# Patient Record
Sex: Male | Born: 1985 | Race: White | Hispanic: No | Marital: Single | Smoking: Current every day smoker
Health system: Southern US, Community
[De-identification: ages and names within clinical notes are randomized; demographics above are authoritative.]

---

## 2004-04-28 ENCOUNTER — Emergency Department: Payer: Self-pay | Admitting: Emergency Medicine

## 2004-04-29 ENCOUNTER — Ambulatory Visit: Payer: Self-pay | Admitting: Emergency Medicine

## 2005-06-25 ENCOUNTER — Emergency Department: Payer: Self-pay | Admitting: Internal Medicine

## 2005-07-01 ENCOUNTER — Ambulatory Visit: Payer: Self-pay | Admitting: Family Medicine

## 2005-07-22 ENCOUNTER — Emergency Department: Payer: Self-pay | Admitting: Emergency Medicine

## 2006-09-04 ENCOUNTER — Emergency Department: Payer: Self-pay | Admitting: Emergency Medicine

## 2009-10-12 ENCOUNTER — Emergency Department: Payer: Self-pay | Admitting: Emergency Medicine

## 2012-06-29 IMAGING — US ABDOMEN ULTRASOUND
1 series · 17 of 25 positions shown · non-contrast
Comparison: none

REASON FOR EXAM: LUQ PAIN; HEAVY DRINKER
COMMENTS:

[Series 1: abdomen ultrasound · 17 of 55 slices shown]
[im 1/55]
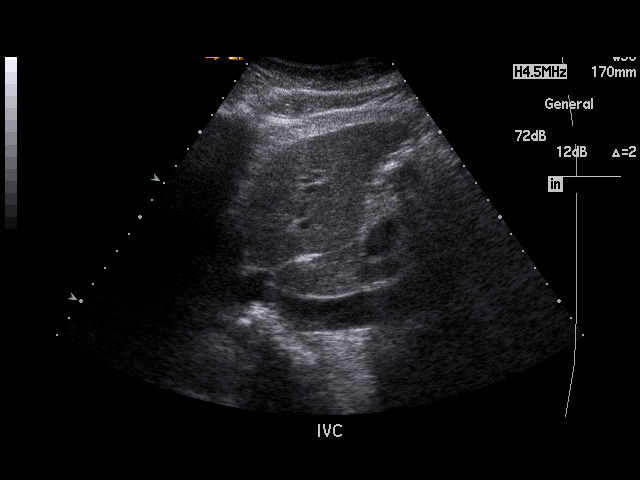
[im 5/55]
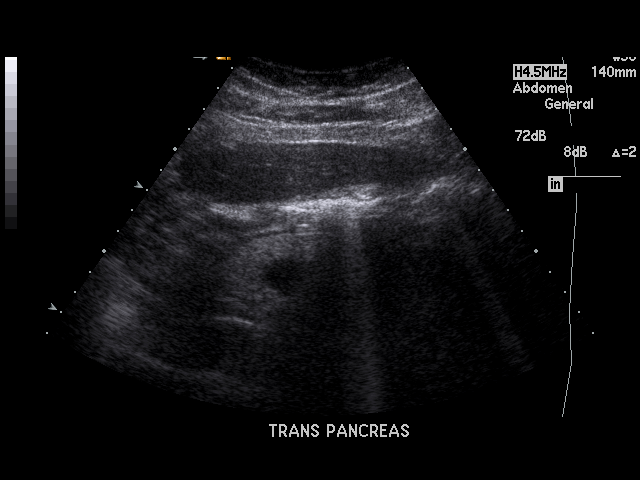
[im 7/55]
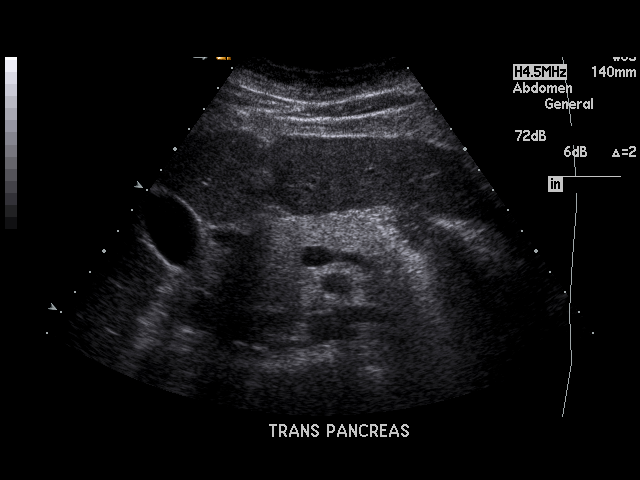
[im 12/55]
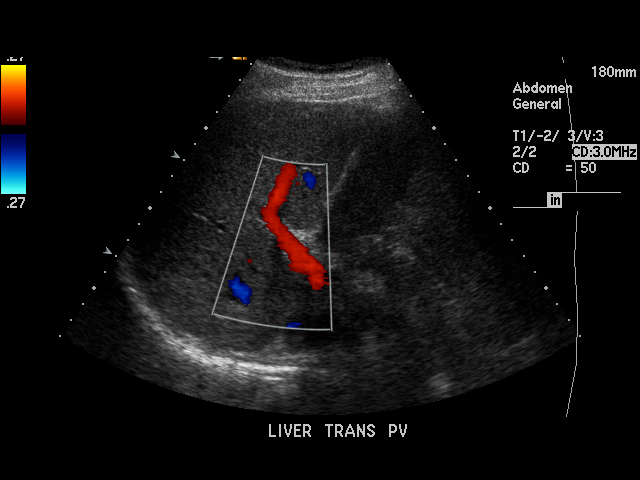
[im 14/55]
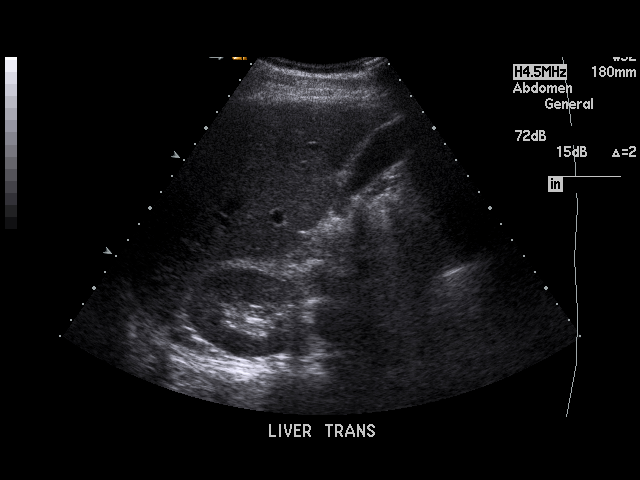
[im 19/55]
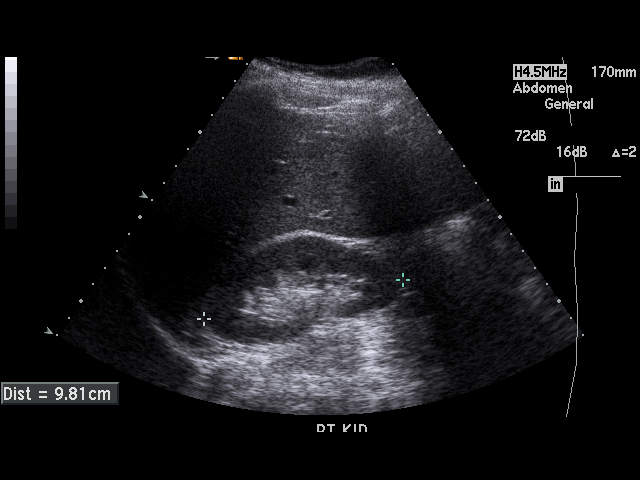
[im 21/55]
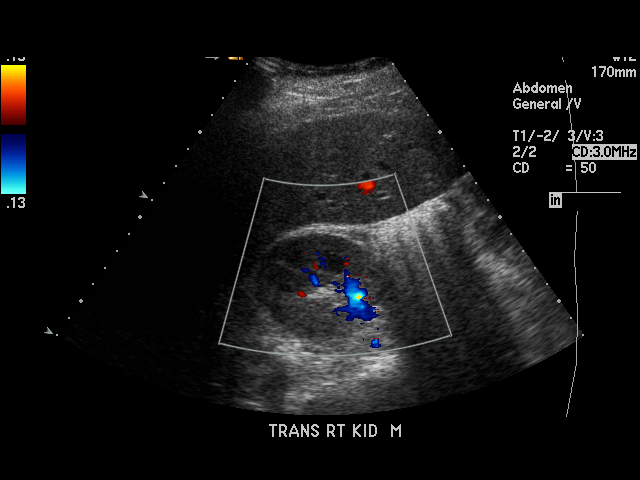
[im 25/55]
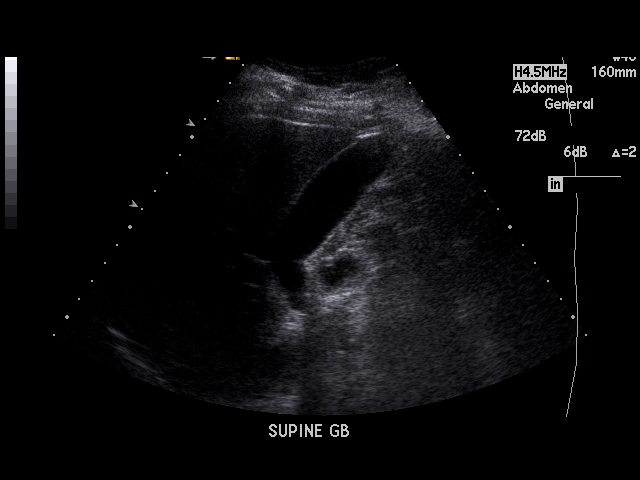
[im 28/55]
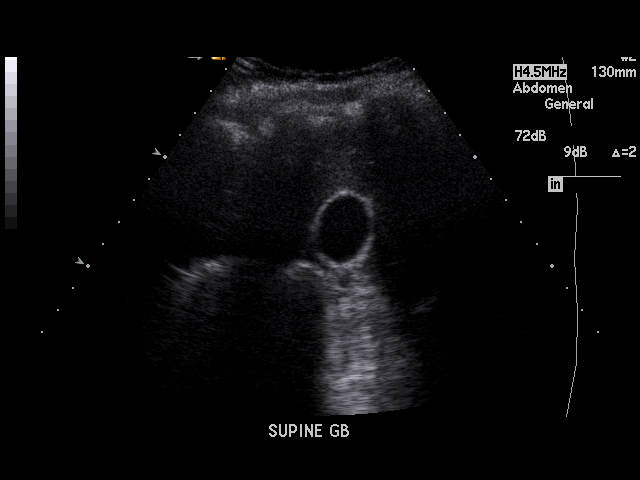
[im 30/55]
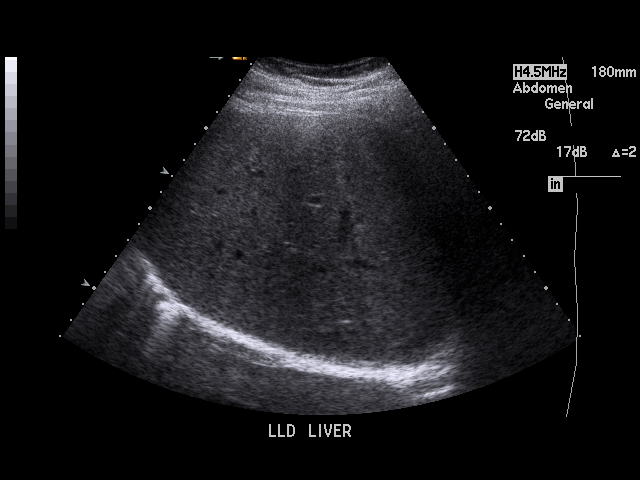
[im 34/55]
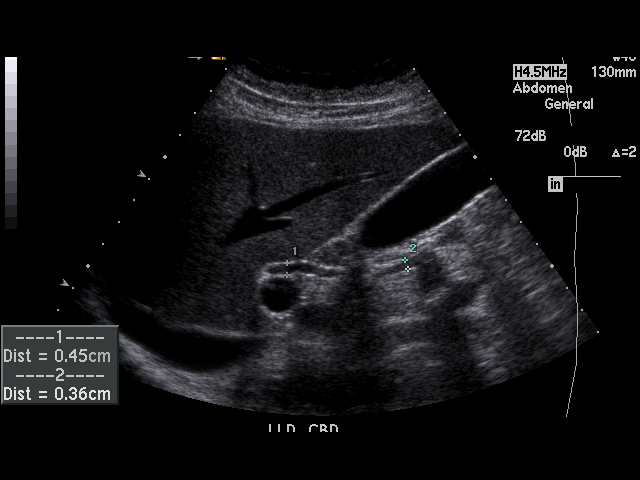
[im 37/55]
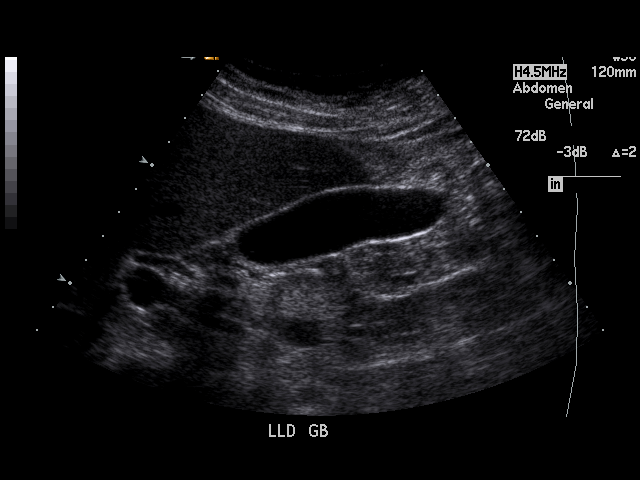
[im 41/55]
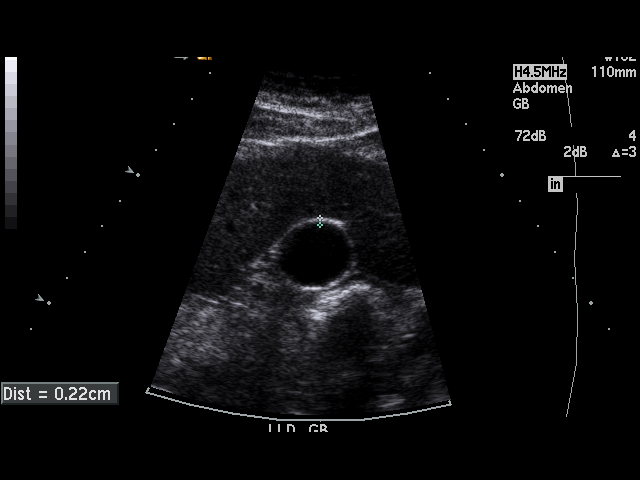
[im 43/55]
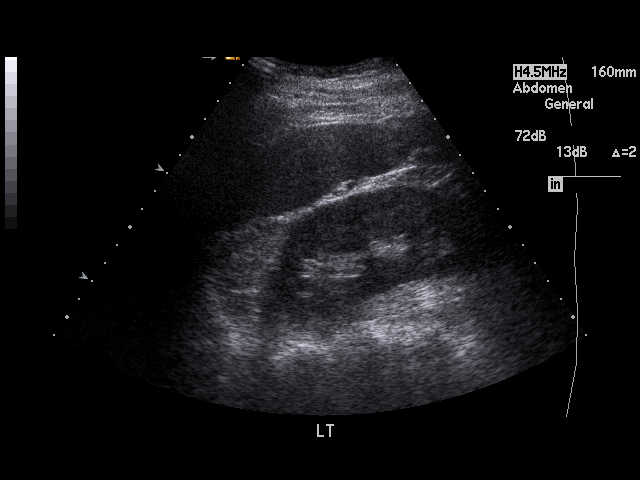
[im 48/55]
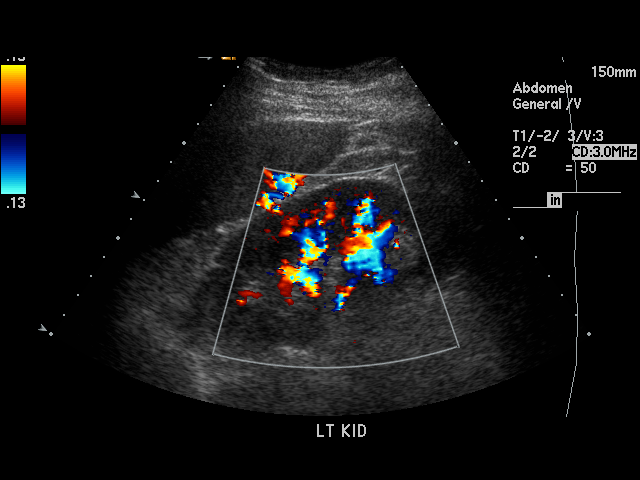
[im 50/55]
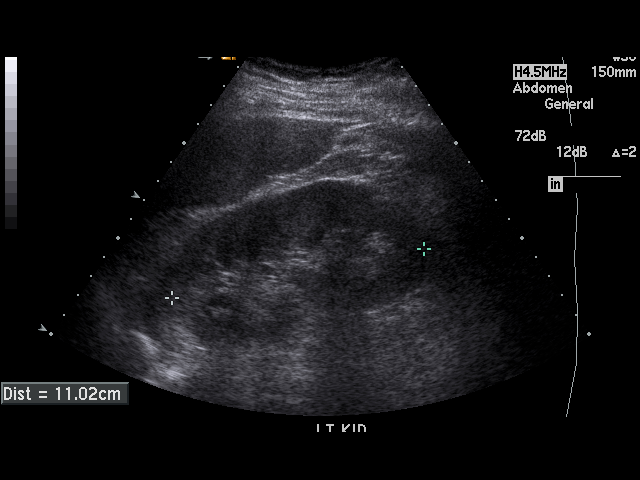
[im 55/55]
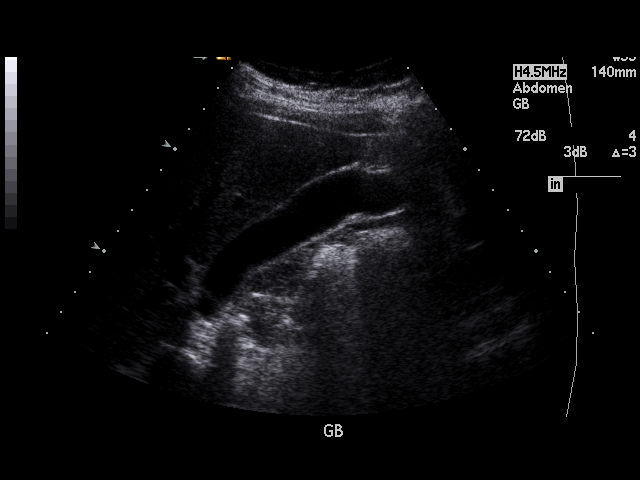

[17 of 25 positions shown; findings below may reference images not displayed]

PROCEDURE:     US  - US ABDOMEN GENERAL SURVEY  - October 12, 2009  [DATE]

RESULT:     Comparison: None.

Technique and findings: Multiple grayscale and color Doppler images were
obtained of the abdomen. The visualized aorta and inferior vena cava are
unremarkable. The visualized liver is unremarkable. The portal vein is
patent. The gallbladder is unremarkable. The sonographic Murphy's sign was
negative. The common bile duct measures 5 mm proximally and 4 mm distally.

Limited views of the kidneys show no hydronephrosis. The visualized spleen
is unremarkable. The right kidney measures 9.8 cm in length and the left
kidney measures 11.0 cm in length.
IMPRESSION: No acute findings.

## 2022-03-24 ENCOUNTER — Emergency Department
Admission: EM | Admit: 2022-03-24 | Discharge: 2022-03-24 | Disposition: A | Discharge status: Home / Self Care | Payer: Self-pay

## 2022-03-24 ENCOUNTER — Ambulatory Visit (INDEPENDENT_AMBULATORY_CARE_PROVIDER_SITE_OTHER): Payer: Self-pay

## 2022-03-24 ENCOUNTER — Ambulatory Visit
Admission: EM | Admit: 2022-03-24 | Discharge: 2022-03-24 | Disposition: A | Discharge status: Home / Self Care | Payer: Self-pay | Attending: Emergency Medicine | Admitting: Emergency Medicine

## 2022-03-24 DIAGNOSIS — R001 Bradycardia, unspecified: Secondary | ICD-10-CM

## 2022-03-24 DIAGNOSIS — R0789 Other chest pain: Secondary | ICD-10-CM

## 2022-03-24 DIAGNOSIS — R0602 Shortness of breath: Secondary | ICD-10-CM

## 2022-03-24 DIAGNOSIS — R062 Wheezing: Secondary | ICD-10-CM

## 2022-03-24 NOTE — ED Notes (Signed)
Patient is being discharged from the Urgent Care and sent to the Emergency Department via EMS . Per Tomma Rakers, NP, patient is in need of higher level of care due to Chest Pressure and Hypertension. Patient is aware and verbalizes understanding of plan of care.  Vitals:   03/24/22 1648  BP: (!) 138/100  Pulse: 64  Resp: 16  Temp: (!) 97.5 F (36.4 C)  SpO2: 100%

## 2022-03-24 NOTE — ED Triage Notes (Signed)
Pt c/o light headed, fatigue, dizziness, congestion, SOB, cough x2weeks  Pt states that he feels chest pressure when standing up straight, Pt feels better when laying down.

## 2022-03-24 NOTE — ED Provider Notes (Addendum)
[facility]    CSN: [id number] Arrival date & time: 03/24/22  1506      History   Chief Complaint Chief Complaint  Patient presents with   Shortness of Breath   Cough   Nasal Congestion    HPI Jesse Dunn is a 36 y.o. male.   HPI  36 year old male here for evaluation of multiple complaints.  The patient reports that for the last 1 to 2 weeks he has been experiencing fatigue, lightheadedness, dizziness, nasal congestion, chest pressure and chest congestion, shortness of breath, and a productive cough.  He denies any fever or runny nose.  His blood pressure is elevated here in clinic but he does not have a history of high blood pressure.  He is a smoker.  He was recently started on Zoloft for depression but states that he stopped taking it due to having a headache and feeling lightheaded.  He has not taken the medication in over 2 weeks.  History reviewed. No pertinent past medical history.  There are no problems to display for this patient.   History reviewed. No pertinent surgical history.     Home Medications    Prior to Admission medications   Not on File    Family History History reviewed. No pertinent family history.  Social History Social History   Tobacco Use   Smoking status: Every Day    Types: Cigarettes   Smokeless tobacco: Never  Vaping Use   Vaping Use: Never used  Substance Use Topics   Alcohol use: Never   Drug use: Never     Allergies   Patient has no allergy information on record.   Review of Systems Review of Systems  Constitutional:  Negative for fever.  HENT:  Positive for congestion and rhinorrhea. Negative for sore throat.   Respiratory:  Positive for cough and shortness of breath.   Cardiovascular:  Positive for chest pain.  Neurological:  Positive for dizziness, light-headedness and headaches. Negative for syncope.  Hematological: Negative.   Psychiatric/Behavioral: Negative.       Physical  Exam Triage Vital Signs ED Triage Vitals  Enc Vitals Group     BP 03/24/22 1648 (!) 138/100     Pulse Rate 03/24/22 1648 64     Resp 03/24/22 1648 16     Temp 03/24/22 1648 (!) 97.5 F (36.4 C)     Temp Source 03/24/22 1648 Oral     SpO2 03/24/22 1648 100 %     Weight 03/24/22 1647 210 lb (95.3 kg)     Height 03/24/22 1647 5\' 11"  (1.803 m)     Head Circumference --      Peak Flow --      Pain Score 03/24/22 1646 5     Pain Loc --      Pain Edu? --      Excl. in GC? --    No data found.  Updated Vital Signs BP (!) 138/100 (BP Location: Left Arm)   Pulse 64   Temp (!) 97.5 F (36.4 C) (Oral)   Resp 16   Ht 5\' 11"  (1.803 m)   Wt 210 lb (95.3 kg)   SpO2 100%   BMI 29.29 kg/m   Visual Acuity Right Eye Distance:   Left Eye Distance:   Bilateral Distance:    Right Eye Near:   Left Eye Near:    Bilateral Near:     Physical Exam Vitals and nursing note reviewed.  Constitutional:  Appearance: Normal appearance. He is not ill-appearing.  HENT:     Head: Normocephalic and atraumatic.     Right Ear: Ear canal and external ear normal. There is no impacted cerumen.     Left Ear: Ear canal and external ear normal. There is no impacted cerumen.     Ears:     Comments: Both tympanic membranes are erythematous and injected.    Nose: Congestion and rhinorrhea present.     Comments: Nasal mucosa is erythematous and edematous with clear discharge in both nares.    Mouth/Throat:     Mouth: Mucous membranes are moist.     Pharynx: Oropharynx is clear. No oropharyngeal exudate or posterior oropharyngeal erythema.  Cardiovascular:     Rate and Rhythm: Normal rate and regular rhythm.     Pulses: Normal pulses.     Heart sounds: Normal heart sounds. No murmur heard.    No friction rub. No gallop.  Pulmonary:     Effort: Pulmonary effort is normal.     Breath sounds: Normal breath sounds. No wheezing, rhonchi or rales.  Musculoskeletal:     Cervical back: Normal range of  motion and neck supple.     Right lower leg: No edema.     Left lower leg: No edema.  Lymphadenopathy:     Cervical: No cervical adenopathy.  Skin:    General: Skin is warm and dry.     Capillary Refill: Capillary refill takes less than 2 seconds.     Findings: No erythema or rash.  Neurological:     General: No focal deficit present.     Mental Status: He is alert and oriented to person, place, and time.  Psychiatric:        Mood and Affect: Mood normal.        Behavior: Behavior normal.        Thought Content: Thought content normal.        Judgment: Judgment normal.      UC Treatments / Results  Labs (all labs ordered are listed, but only abnormal results are displayed) Labs Reviewed - No data to display  EKG Marked sinus bradycardia with ventricular to 49 bpm PR interval 186 ms QRS duration 96 ms QT/QTc 444/401 ms Incomplete right bundle branch block.   Radiology DG Chest 2 View  Result Date: 03/24/2022 CLINICAL DATA:  Lightheadedness, fatigue, cough EXAM: CHEST - 2 VIEW COMPARISON:  None Available. FINDINGS: Frontal and lateral views of the chest demonstrate an unremarkable cardiac silhouette. No acute airspace disease, effusion, or pneumothorax. There are no acute bony abnormalities. Reconstructed images demonstrate no additional findings. IMPRESSION: 1. No acute intrathoracic process. Electronically Signed   By: Sharlet Salina M.D.   On: 03/24/2022 17:38    Procedures Procedures (including critical care time)  Medications Ordered in UC Medications - No data to display  Initial Impression / Assessment and Plan / UC Course  I have reviewed the triage vital signs and the nursing notes.  Pertinent labs & imaging results that were available during my care of the patient were reviewed by me and considered in my medical decision making (see chart for details).   Patient is a nontoxic-appearing 36 year old male here for evaluation of respiratory complaints as  outlined in the HPI above.  He is also complaining of dizziness, lightheadedness, chest pressure, chest congestion, cough, and shortness of breath.  He is not dyspneic or tachypneic and can speak in full sentences.  Oxygen saturation 100% and his respiratory  rate is 16.  He is hypertensive in clinic without a document history of hypertension though he is a smoker.  I will obtain a chest x-ray and also an EKG to evaluate the symptoms.  His physical exam also reveals erythema injection of both tympanic membranes as well as inflammation of his nasal mucosa.  His exam is consistent with an upper respiratory infection and most likely otitis media which could be contributing to the dizziness and the lightheadedness.  Radiology impression of chest x-ray states no acute intrathoracic process.  Patient's EKG shows marked sinus bradycardia with incomplete right bundle branch block.  There are no other tracings in epic for comparison.  Given the patient's weeklong symptoms of fatigue, and dizziness, chest pressure, and shortness of breath coupled with his abnormal EKG I feel he should best be evaluated in the emergency department.  I also feel that he should travel via EMS.  I discussed this with the patient and he is in agreement.  I will order a peripheral IV and have him transported to [facility] by EMS.  Report given to first responders.  Care transferred.  Final Clinical Impressions(s) / UC Diagnoses   Final diagnoses:  Chest pressure  Sinus bradycardia  Shortness of breath     Discharge Instructions      Please go to the ER via EMS for evaluation of your chest pressure, shortness of breath, dizziness, and lightheadedness.     ED Prescriptions   None    PDMP not reviewed this encounter.   Becky Augusta, NP 03/24/22 Merrily Brittle    Becky Augusta, NP 03/24/22 [phone]

## 2022-03-24 NOTE — ED Triage Notes (Signed)
Pt comes with c/o CP. Pt states this started today. Pt states he has been sick for week. Pt was given 324 aspirin.

## 2022-03-24 NOTE — ED Notes (Signed)
Called EMS to transport Pt to the Hospital.

## 2022-03-24 NOTE — Discharge Instructions (Addendum)
Please go to the ER via EMS for evaluation of your chest pressure, shortness of breath, dizziness, and lightheadedness.

## 2022-03-25 ENCOUNTER — Emergency Department
Admission: EM | Admit: 2022-03-25 | Discharge: 2022-03-25 | Disposition: A | Discharge status: Home / Self Care | Payer: Self-pay | Attending: Emergency Medicine | Admitting: Emergency Medicine

## 2022-03-25 ENCOUNTER — Other Ambulatory Visit: Payer: Self-pay

## 2022-03-25 ENCOUNTER — Encounter: Payer: Self-pay | Admitting: Emergency Medicine

## 2022-03-25 ENCOUNTER — Emergency Department: Payer: Self-pay

## 2022-03-25 DIAGNOSIS — J101 Influenza due to other identified influenza virus with other respiratory manifestations: Secondary | ICD-10-CM | POA: Insufficient documentation

## 2022-03-25 DIAGNOSIS — Z1152 Encounter for screening for COVID-19: Secondary | ICD-10-CM | POA: Insufficient documentation

## 2022-03-25 DIAGNOSIS — R091 Pleurisy: Secondary | ICD-10-CM

## 2022-03-25 DIAGNOSIS — J111 Influenza due to unidentified influenza virus with other respiratory manifestations: Secondary | ICD-10-CM

## 2022-03-25 LAB — COMPREHENSIVE METABOLIC PANEL
ALT: 14 U/L (ref 0–44)
AST: 19 U/L (ref 15–41)
Albumin: 4.5 g/dL (ref 3.5–5.0)
Alkaline Phosphatase: 111 U/L (ref 38–126)
Anion gap: 8 (ref 5–15)
BUN: 24 mg/dL — ABNORMAL HIGH (ref 6–20)
CO2: 26 mmol/L (ref 22–32)
Calcium: 9.5 mg/dL (ref 8.9–10.3)
Chloride: 100 mmol/L (ref 98–111)
Creatinine, Ser: 1.19 mg/dL (ref 0.61–1.24)
GFR, Estimated: >60 mL/min (ref >60)
Glucose, Bld: 111 mg/dL — ABNORMAL HIGH (ref 70–99)
Potassium: 4.9 mmol/L (ref 3.5–5.1)
Sodium: 134 mmol/L — ABNORMAL LOW (ref 135–145)
Total Bilirubin: 1.2 mg/dL (ref 0.3–1.2)
Total Protein: 8.3 g/dL — ABNORMAL HIGH (ref 6.5–8.1)

## 2022-03-25 LAB — CBC WITH DIFFERENTIAL/PLATELET
Abs Immature Granulocytes: 0.03 10*3/uL (ref 0.00–0.07)
Basophils Absolute: 0 10*3/uL (ref 0.0–0.1)
Basophils Relative: 0 %
Eosinophils Absolute: 0.2 10*3/uL (ref 0.0–0.5)
Eosinophils Relative: 2 %
HCT: 49.9 % (ref 39.0–52.0)
Hemoglobin: 16.2 g/dL (ref 13.0–17.0)
Immature Granulocytes: 0 %
Lymphocytes Relative: 35 %
Lymphs Abs: 3.1 10*3/uL (ref 0.7–4.0)
MCH: 30.1 pg (ref 26.0–34.0)
MCHC: 32.5 g/dL (ref 30.0–36.0)
MCV: 92.8 fL (ref 80.0–100.0)
Monocytes Absolute: 0.5 10*3/uL (ref 0.1–1.0)
Monocytes Relative: 6 %
Neutro Abs: 5 10*3/uL (ref 1.7–7.7)
Neutrophils Relative %: 57 %
Platelets: 243 10*3/uL (ref 150–400)
RBC: 5.38 MIL/uL (ref 4.22–5.81)
RDW: 11.8 % (ref 11.5–15.5)
WBC: 8.7 10*3/uL (ref 4.0–10.5)
nRBC: 0 % (ref 0.0–0.2)

## 2022-03-25 LAB — URINALYSIS, ROUTINE W REFLEX MICROSCOPIC
Bilirubin Urine: NEGATIVE
Glucose, UA: NEGATIVE mg/dL
Hgb urine dipstick: NEGATIVE
Ketones, ur: NEGATIVE mg/dL
Leukocytes,Ua: NEGATIVE
Nitrite: NEGATIVE
Protein, ur: NEGATIVE mg/dL
Specific Gravity, Urine: 1.009 (ref 1.005–1.030)
pH: 6 (ref 5.0–8.0)

## 2022-03-25 LAB — RESP PANEL BY RT-PCR (RSV, FLU A&B, COVID)  RVPGX2
Influenza A by PCR: POSITIVE — AB
Influenza B by PCR: NEGATIVE
Resp Syncytial Virus by PCR: NEGATIVE
SARS Coronavirus 2 by RT PCR: NEGATIVE

## 2022-03-25 LAB — LIPASE, BLOOD: Lipase: 30 U/L (ref 11–51)

## 2022-03-25 LAB — TROPONIN I (HIGH SENSITIVITY)
Troponin I (High Sensitivity): 2 ng/L (ref <18)
Troponin I (High Sensitivity): <2 ng/L (ref <18)

## 2022-03-25 MED ORDER — PREDNISONE 20 MG PO TABS: 40.0000 mg | ORAL_TABLET | Freq: Every day | ORAL | 0 refills | Status: AC

## 2022-03-25 MED ORDER — ALBUTEROL SULFATE HFA 108 (90 BASE) MCG/ACT IN AERS: 2.0000 | INHALATION_SPRAY | Freq: Four times a day (QID) | RESPIRATORY_TRACT | 2 refills | Status: AC | PRN

## 2022-03-25 MED ORDER — BENZONATATE 100 MG PO CAPS: 100.0000 mg | ORAL_CAPSULE | Freq: Four times a day (QID) | ORAL | 0 refills | Status: AC | PRN

## 2022-03-25 NOTE — ED Provider Notes (Signed)
[facility] Provider Note    Event Date/Time   First MD Initiated Contact with Patient 03/25/22 1039     (approximate)   History   Chest Pain   HPI  Jesse Dunn is a 36 y.o. male has been experiencing 1 week of runny nose cough and congestion.  Still eating and drinking but reports less solid food than typical.  He has also had bodyaches and fatigue.  He went to urgent care, and was referred to the ER because they reported his heart rate was low.  Patient reports he feels like he just needs to go home drink fluids and recover from what he thinks is the flu.  He does have achiness and chest pain when he takes a deep breath or coughs but reports he does not feel particularly short of breath just fatigued and very achy.     Physical Exam   Triage Vital Signs: ED Triage Vitals  Enc Vitals Group     BP 03/25/22 0522 (!) 91/55     Pulse Rate 03/25/22 0522 61     Resp 03/25/22 0522 18     Temp 03/25/22 0522 98.1 F (36.7 C)     Temp Source 03/25/22 0522 Oral     SpO2 03/25/22 0522 100 %     Weight 03/25/22 0120 200 lb (90.7 kg)     Height 03/25/22 0120 5\' 11"  (1.803 m)     Head Circumference --      Peak Flow --      Pain Score 03/25/22 0120 4     Pain Loc --      Pain Edu? --      Excl. in GC? --     Most recent vital signs: Vitals:   03/25/22 0522 03/25/22 1038  BP: (!) 91/55   Pulse: 61 61  Resp: 18 16  Temp: 98.1 F (36.7 C) 98.1 F (36.7 C)  SpO2: 100% 100%     General: Awake, no distress.  Occasional cough.  No respiratory distress.  Alert well-oriented conversant mucous membranes moist CV:  Good peripheral perfusion.  Normal rate tones, heart rate 50-60 Resp:  Normal effort.  Clear bilaterally occasional cough is noted Abd:  No distention.  Other:  Sits up moves about without distress.  Oriented,   ED Results / Procedures / Treatments   Labs (all labs ordered are listed, but only abnormal results are  displayed) Labs Reviewed  RESP PANEL BY RT-PCR (RSV, FLU A&B, COVID)  RVPGX2 - Abnormal; Notable for the following components:      Result Value   Influenza A by PCR POSITIVE (*)    All other components within normal limits  COMPREHENSIVE METABOLIC PANEL - Abnormal; Notable for the following components:   Sodium 134 (*)    Glucose, Bld 111 (*)    BUN 24 (*)    Total Protein 8.3 (*)    All other components within normal limits  URINALYSIS, ROUTINE W REFLEX MICROSCOPIC - Abnormal; Notable for the following components:   Color, Urine YELLOW (*)    APPearance CLEAR (*)    All other components within normal limits  CBC WITH DIFFERENTIAL/PLATELET  LIPASE, BLOOD  TROPONIN I (HIGH SENSITIVITY)  TROPONIN I (HIGH SENSITIVITY)   Labs reviewed most notable for positive influenza test.  EKG  Interpreted as normal sinus rhythm, heart rate 55, QRS 90 QTc 400.  No evidence of acute ischemia or ectopy.  Borderline bradycardia, but given the patient's  age suspect this is unlikely to represent pathology   RADIOLOGY  DG Chest 2 View  Result Date: 03/24/2022 CLINICAL DATA:  Lightheadedness, fatigue, cough EXAM: CHEST - 2 VIEW COMPARISON:  None Available. FINDINGS: Frontal and lateral views of the chest demonstrate an unremarkable cardiac silhouette. No acute airspace disease, effusion, or pneumothorax. There are no acute bony abnormalities. Reconstructed images demonstrate no additional findings. IMPRESSION: 1. No acute intrathoracic process. Electronically Signed   By: Sharlet Salina M.D.   On: 03/24/2022 17:38    Chest x-ray negative for acute finding.   PROCEDURES:  Critical Care performed: No  Procedures   MEDICATIONS ORDERED IN ED: Medications - No data to display   IMPRESSION / MDM / ASSESSMENT AND PLAN / ED COURSE  I reviewed the triage vital signs and the nursing notes.                              Differential diagnosis includes, but is not limited to, influenza, myalgias,  viral illness, no evidence to support pneumonia or obvious acute cardiac process.  EKG reassuring normal troponins.  Symptoms of achiness in the chest seem consistent with myalgias or pleurisy associated with influenza.  He is outside treatment window for Tamiflu.  Patient's presentation is most consistent with acute presentation with potential threat to life or bodily function.  The patient is on the cardiac monitor to evaluate for evidence of arrhythmia and/or significant heart rate changes.  Clinical signs symptoms and clinical history consistent with influenza.  No evidence of complication.  Return precautions and treatment recommendations and follow-up discussed with the patient who is agreeable with the plan.      FINAL CLINICAL IMPRESSION(S) / ED DIAGNOSES   Final diagnoses:  Influenza  Pleurisy     Rx / DC Orders   ED Discharge Orders          Ordered    benzonatate (TESSALON PERLES) 100 MG capsule  Every 6 hours PRN        03/25/22 1257    albuterol (VENTOLIN HFA) 108 (90 Base) MCG/ACT inhaler  Every 6 hours PRN        03/25/22 1257    predniSONE (DELTASONE) 20 MG tablet  Daily with breakfast        03/25/22 1257             Note:  This document was prepared using Dragon voice recognition software and may include unintentional dictation errors.   Sharyn Creamer, MD 03/25/22 [phone]

## 2022-03-25 NOTE — ED Triage Notes (Signed)
Pt found sitting in lobby; st has been here since 6pm and not been seen; previous chart notes pt came in by EMS but did not answer when called and was dismissed from the WR; pt st was seen at urgent care today for flu-like symptoms and was sent over by EMS for "low heart rate"; pt c/o nonprod cough, dizziness, SHOB and chest pressure; st home COVID test was neg

## 2022-03-25 NOTE — ED Notes (Signed)
Says sick for couple weeks with cough, nasal drainage.  He is alert and in nad--blowing nose.

## 2022-03-25 NOTE — ED Provider Triage Note (Signed)
Emergency Medicine Provider Triage Evaluation Note  Jesse Dunn , a 36 y.o. male  was evaluated in triage.  Pt complains of chest pain, cough/congestion, burning.  Review of Systems  Positive: Chest pain Negative: Shortness of breath  Physical Exam  Ht 5\' 11"  (1.803 m)   Wt 90.7 kg   BMI 27.89 kg/m  Gen:   Awake, no distress   Resp:  Normal effort  MSK:   Moves extremities without difficulty  Other:  No petechiae  Medical Decision Making  Medically screening exam initiated at 1:28 AM.  Appropriate orders placed.  Jesse Dunn was informed that the remainder of the evaluation will be completed by another provider, this initial triage assessment does not replace that evaluation, and the importance of remaining in the ED until their evaluation is complete.  36 year old male presenting with cold-like symptoms and chest pain.  Will obtain cardiac panel, respiratory panel, chest x-ray while patient awaits treatment room.   30, MD 03/25/22 [phone]

## 2023-12-05 ENCOUNTER — Emergency Department

## 2023-12-05 ENCOUNTER — Emergency Department
Admission: EM | Admit: 2023-12-05 | Discharge: 2023-12-05 | Disposition: A | Discharge status: Home / Self Care | Attending: Emergency Medicine | Admitting: Emergency Medicine

## 2023-12-05 DIAGNOSIS — F32A Depression, unspecified: Secondary | ICD-10-CM | POA: Diagnosis not present

## 2023-12-05 DIAGNOSIS — K610 Anal abscess: Secondary | ICD-10-CM | POA: Diagnosis present

## 2023-12-05 DIAGNOSIS — R509 Fever, unspecified: Secondary | ICD-10-CM | POA: Insufficient documentation

## 2023-12-05 DIAGNOSIS — F1920 Other psychoactive substance dependence, uncomplicated: Secondary | ICD-10-CM | POA: Diagnosis not present

## 2023-12-05 DIAGNOSIS — R45851 Suicidal ideations: Secondary | ICD-10-CM | POA: Diagnosis not present

## 2023-12-05 DIAGNOSIS — F1193 Opioid use, unspecified with withdrawal: Secondary | ICD-10-CM | POA: Diagnosis not present

## 2023-12-05 LAB — COMPREHENSIVE METABOLIC PANEL WITH GFR
ALT: 30 U/L (ref 0–44)
AST: 31 U/L (ref 15–41)
Albumin: 3.9 g/dL (ref 3.5–5.0)
Alkaline Phosphatase: 191 U/L — ABNORMAL HIGH (ref 38–126)
Anion gap: 10 (ref 5–15)
BUN: 14 mg/dL (ref 6–20)
CO2: 26 mmol/L (ref 22–32)
Calcium: 9 mg/dL (ref 8.9–10.3)
Chloride: 102 mmol/L (ref 98–111)
Creatinine, Ser: 0.99 mg/dL (ref 0.61–1.24)
GFR, Estimated: >60 mL/min (ref >60)
Glucose, Bld: 103 mg/dL — ABNORMAL HIGH (ref 70–99)
Potassium: 4.4 mmol/L (ref 3.5–5.1)
Sodium: 138 mmol/L (ref 135–145)
Total Bilirubin: 1.2 mg/dL (ref 0.0–1.2)
Total Protein: 9 g/dL — ABNORMAL HIGH (ref 6.5–8.1)

## 2023-12-05 LAB — CBC WITH DIFFERENTIAL/PLATELET
Abs Immature Granulocytes: 0.02 K/uL (ref 0.00–0.07)
Basophils Absolute: 0 K/uL (ref 0.0–0.1)
Basophils Relative: 1 %
Eosinophils Absolute: 0.1 K/uL (ref 0.0–0.5)
Eosinophils Relative: 1 %
HCT: 46 % (ref 39.0–52.0)
Hemoglobin: 15.1 g/dL (ref 13.0–17.0)
Immature Granulocytes: 0 %
Lymphocytes Relative: 42 %
Lymphs Abs: 3.6 K/uL (ref 0.7–4.0)
MCH: 30.4 pg (ref 26.0–34.0)
MCHC: 32.8 g/dL (ref 30.0–36.0)
MCV: 92.7 fL (ref 80.0–100.0)
Monocytes Absolute: 0.6 K/uL (ref 0.1–1.0)
Monocytes Relative: 7 %
Neutro Abs: 4.3 K/uL (ref 1.7–7.7)
Neutrophils Relative %: 49 %
Platelets: 218 K/uL (ref 150–400)
RBC: 4.96 MIL/uL (ref 4.22–5.81)
RDW: 13.4 % (ref 11.5–15.5)
WBC: 8.7 K/uL (ref 4.0–10.5)
nRBC: 0 % (ref 0.0–0.2)

## 2023-12-05 LAB — LACTIC ACID, PLASMA: Lactic Acid, Venous: 1.2 mmol/L (ref 0.5–1.9)

## 2023-12-05 LAB — RESP PANEL BY RT-PCR (RSV, FLU A&B, COVID)  RVPGX2
Influenza A by PCR: NEGATIVE
Influenza B by PCR: NEGATIVE
Resp Syncytial Virus by PCR: NEGATIVE
SARS Coronavirus 2 by RT PCR: NEGATIVE

## 2023-12-05 MED ORDER — IOHEXOL 300 MG/ML  SOLN
100.0000 mL | Freq: Once | INTRAMUSCULAR | Status: AC | PRN
  Administered 2023-12-05: 100 mL via INTRAVENOUS

## 2023-12-05 MED ORDER — SODIUM CHLORIDE 0.9 % IV BOLUS
1000.0000 mL | Freq: Once | INTRAVENOUS | Status: AC
  Administered 2023-12-05: 1000 mL via INTRAVENOUS

## 2023-12-05 MED ORDER — BUPRENORPHINE HCL-NALOXONE HCL 8-2 MG SL FILM: 1.0000 | ORAL_FILM | Freq: Three times a day (TID) | SUBLINGUAL | 0 refills | Status: AC

## 2023-12-05 MED ORDER — LIDOCAINE-EPINEPHRINE 2 %-1:100000 IJ SOLN
20.0000 mL | Freq: Once | INTRAMUSCULAR | Status: AC
  Administered 2023-12-05: 20 mL
  Filled 2023-12-05: qty 1

## 2023-12-05 MED ORDER — KETOROLAC TROMETHAMINE 15 MG/ML IJ SOLN
15.0000 mg | Freq: Once | INTRAMUSCULAR | Status: AC
  Administered 2023-12-05: 15 mg via INTRAVENOUS
  Filled 2023-12-05: qty 1

## 2023-12-05 MED ORDER — SULFAMETHOXAZOLE-TRIMETHOPRIM 800-160 MG PO TABS: 1.0000 | ORAL_TABLET | Freq: Two times a day (BID) | ORAL | 0 refills | Status: AC

## 2023-12-05 MED ORDER — CEPHALEXIN 500 MG PO CAPS
500.0000 mg | ORAL_CAPSULE | Freq: Three times a day (TID) | ORAL | 0 refills | Status: AC
Start: 2023-12-05 — End: ?

## 2023-12-05 MED ORDER — OLANZAPINE 5 MG PO TABS: 5.0000 mg | ORAL_TABLET | Freq: Every day | ORAL | 0 refills | Status: AC

## 2023-12-05 NOTE — Discharge Instructions (Addendum)
 Take the 2 antibiotics as prescribed for your skin infection.  Applying warm compresses to the area can help speed recovery.  Please make an appointment to follow-up with surgery in 1 week to ensure that the area has healed up completely.  Psychiatry evaluated you today and recommended increasing your Suboxone  to 3 times daily.  We have sent a new prescription for this per their recommendation.  Please follow-up with your current mental health provider for further medication management.

## 2023-12-05 NOTE — BH Assessment (Signed)
 Comprehensive Clinical Assessment (CCA) Screening, Triage and Referral Note  12/05/2023 Jesse Dunn [id number]  Chief Complaint:  Patient presents with a painful, swollen lump in the perineal area for approximately one week. Denies pain to testicles, urinary changes, or discharge. No prior history of abscesses. Additionally, patient reports cough, tactile fevers, and runny nose, suggesting possible upper respiratory infection.  Recently initiated Suboxone  treatment within the past two weeks. No other substances reported at this time.  During triage, patient endorsed suicidal ideation within the past month. Described a general plan involving carbon monoxide poisoning: "run a hose from a car's exhaust and sit inside."No current intent reported during triage, but plan indicates elevated risk.  Visit Diagnosis: Major Depression  Patient Reported Information How did you hear about us ? No data recorded What Is the Reason for Your Visit/Call Today? No data recorded How Long Has This Been Causing You Problems? No data recorded What Do You Feel Would Help You the Most Today? No data recorded  Have You Recently Had Any Thoughts About Hurting Yourself? No data recorded Are You Planning to Commit Suicide/Harm Yourself At This time? No data recorded  Have you Recently Had Thoughts About Hurting Someone [name]? No data recorded Are You Planning to Harm Someone at This Time? No data recorded Explanation: No data recorded  Have You Used Any Alcohol or Drugs in the Past 24 Hours? No data recorded How Long Ago Did You Use Drugs or Alcohol? No data recorded What Did You Use and How Much? No data recorded  Do You Currently Have a Therapist/Psychiatrist? No data recorded Name of Therapist/Psychiatrist: No data recorded  Have You Been Recently Discharged From Any Office Practice or Programs? No data recorded Explanation of Discharge From Practice/Program: No data recorded   CCA Screening Triage  Referral Assessment Type of Contact: No data recorded Telemedicine Service Delivery:   Is this Initial or Reassessment?   Date Telepsych consult ordered in [facility]:    Time Telepsych consult ordered in [facility]:    Location of Assessment[facility]  Provider Location: No data recorded   Collateral Involvement: No data recorded  Does Patient Have a Court Appointed Legal Guardian? No data recorded Name and Contact of Legal Guardian: N/A  If Minor and Not Living with Parent(s), Who has Custody? No data recorded Is CPS involved or ever been involved? No data recorded Is APS involved or ever been involved? No data recorded  Patient Determined To Be At Risk for Harm To Self or Others Based on Review of Patient Reported Information or Presenting Complaint? No data recorded Method: No data recorded Availability of Means: No data recorded Intent: No data recorded Notification Required: No data recorded Additional Information for Danger to Others Potential: No data recorded Additional Comments for Danger to Others Potential: No data recorded Are There Guns or Other Weapons in Your Home? No data recorded Types of Guns/Weapons: No data recorded Are These Weapons Safely Secured?                            No data recorded Who Could Verify You Are Able To Have These Secured: No data recorded Do You Have any Outstanding Charges, Pending Court Dates, Parole/Probation? No data recorded Contacted To Inform of Risk of Harm To Self or Others: No data recorded  Does Patient Present under Involuntary Commitment? Voluntary   [place] of Residence:   Patient Currently Receiving the Following Services: None reported  Determination  of Need: Emergent  Options For Referral: Inpatient hospitalization  Disposition Recommendation per psychiatric provider: Pending psych consult  [name], Counselor

## 2023-12-05 NOTE — ED Notes (Signed)
 Pt will be taken to xray and to treatment room.

## 2023-12-05 NOTE — ED Provider Notes (Signed)
 [facility] Provider Note    Event Date/Time   First MD Initiated Contact with Patient 12/05/23 2004     (approximate)   History   Chief Complaint: Groin Pain and Suicidal   HPI  Jesse Dunn is a 38 y.o. male with no significant past medical history who comes ED complaining of painful swollen area on the perineum.  No dysuria, no intrascrotal pain.  No black or bloody stool.  Denies fever.  No trauma to the area.  Also reports having suicidal feelings for about a month.  Due to his acute pain issue in the groin, these feelings have worsened recently and he started thinking of a plan to kill himself by carbon monoxide poisoning.        No past medical history on file.  Current Outpatient Rx   Order #: [id number] Class: Normal   Order #: [id number] Class: Normal   Order #: [id number] Class: Normal   Order #: [id number] Class: Normal   Order #: [id number] Class: Normal   Order #: [id number] Class: Normal    No past surgical history on file.  Physical Exam   Triage Vital Signs: ED Triage Vitals [12/05/23 1937]  Encounter Vitals Group     BP 114/79     Girls Systolic BP Percentile      Girls Diastolic BP Percentile      Boys Systolic BP Percentile      Boys Diastolic BP Percentile      Pulse Rate (!) 114     Resp 16     Temp (!) 101.3 F (38.5 C)     Temp Source Oral     SpO2 99 %     Weight      Height      Head Circumference      Peak Flow      Pain Score 6     Pain Loc      Pain Education      Exclude from Growth Chart     Most recent vital signs: Vitals:   12/05/23 1937  BP: 114/79  Pulse: (!) 114  Resp: 16  Temp: (!) 101.3 F (38.5 C)  SpO2: 99%    General: Awake, no distress.  CV:  Good peripheral perfusion.  Tachycardia, heart rate 100 Resp:  Normal effort.  Clear lungs Abd:  No distention.  Soft nontender Other:  Scrotum and genitalia normal.  There is warmth redness tenderness induration of the right inner glute  perianally.  No fluctuance or drainage.   ED Results / Procedures / Treatments   Labs (all labs ordered are listed, but only abnormal results are displayed) Labs Reviewed  COMPREHENSIVE METABOLIC PANEL WITH GFR - Abnormal; Notable for the following components:      Result Value   Glucose, Bld 103 (*)    Total Protein 9.0 (*)    Alkaline Phosphatase 191 (*)    All other components within normal limits  RESP PANEL BY RT-PCR (RSV, FLU A&B, COVID)  RVPGX2  CULTURE, BLOOD (ROUTINE X 2)  CULTURE, BLOOD (ROUTINE X 2)  LACTIC ACID, PLASMA  CBC WITH DIFFERENTIAL/PLATELET  URINALYSIS, W/ REFLEX TO CULTURE (INFECTION SUSPECTED)     EKG    RADIOLOGY CT abdomen pelvis interpreted by me, shows right perianal abscess measuring 4 cm.  Radiology report reviewed  Chest x-ray unremarkable   PROCEDURES:  .Incision and Drainage  Date/Time: 12/05/2023 11:12 PM  Performed by: Viviann Pastor, MD Authorized by: Viviann Pastor, MD   Consent:  Consent obtained:  Verbal   Consent given by:  Patient   Risks discussed:  Bleeding, infection, incomplete drainage, pain and damage to other organs   Alternatives discussed:  Alternative treatment, delayed treatment and observation Universal protocol:    Patient identity confirmed:  Verbally with patient and arm band Location:    Type:  Abscess   Size:  4   Location:  Anogenital   Anogenital location:  Perianal Pre-procedure details:    Skin preparation:  Chlorhexidine with alcohol Sedation:    Sedation type:  None Anesthesia:    Anesthesia method:  Local infiltration   Local anesthetic:  Lidocaine  1% w/o epi and lidocaine  1% WITH epi Procedure type:    Complexity:  Complex Procedure details:    Needle aspiration: yes     Needle size:  18 G   Incision types:  Stab incision   Incision depth:  Subcutaneous   Wound management:  Probed and deloculated and irrigated with saline   Drainage:  Purulent   Drainage amount:  Scant   Wound  treatment:  Wound left open   Packing materials:  None Post-procedure details:    Procedure completion:  Tolerated well, no immediate complications    MEDICATIONS ORDERED IN ED: Medications  ketorolac  (TORADOL ) 15 MG/ML injection 15 mg (15 mg Intravenous Given 12/05/23 2031)  iohexol  (OMNIPAQUE ) 300 MG/ML solution 100 mL (100 mLs Intravenous Contrast Given 12/05/23 2052)  lidocaine -EPINEPHrine  (XYLOCAINE  W/EPI) 2 %-1:100000 (with pres) injection 20 mL (20 mLs Infiltration Given 12/05/23 2145)  sodium chloride  0.9 % bolus 1,000 mL (1,000 mLs Intravenous New Bag/Given 12/05/23 2145)     IMPRESSION / MDM / ASSESSMENT AND PLAN / ED COURSE  I reviewed the triage vital signs and the nursing notes.  DDx: Perianal abscess, cellulitis, electrolyte derangement, major depression  Patient's presentation is most consistent with acute presentation with potential threat to life or bodily function.  Patient presents with suicidal thoughts, worsening over the past month gradually.  Not meeting criteria for IVC currently.  Will request psychiatry evaluation.  Also has fever, tachycardia, inflammatory changes at the right perianal soft tissue.  He is nontoxic, normal blood pressure, normal lactate.  Not septic.  Will give IV fluids and Toradol .  CT shows a small perianal abscess.  Will attempt local anesthesia and I&D.   Clinical Course as of 12/05/23 2311  [name] Dec 05, 2023  2232 D/w psych after their eval: suboxone  needs to go from tid to qid.  he only sleeps 3 hours a night.  please prescribe Zyprexa   5 mg tab.  one to 2 tab po at bedtime.   [PS]    Clinical Course User Index [PS] Viviann Pastor, MD    ----------------------------------------- 11:11 PM on 12/05/2023 ----------------------------------------- PDMP and patient confirmed he currently takes Suboxone  twice daily.  Will prescribe 3 times daily along with Zyprexa  as recommended by psychiatry.  Also Keflex  and Bactrim  for his perianal  cellulitis/abscess.  CT finding was discussed with general surgery who reviewed images with me, advising this would be appropriate for simple I&D from external perianal approach   FINAL CLINICAL IMPRESSION(S) / ED DIAGNOSES   Final diagnoses:  Perianal abscess  Opioid withdrawal (HCC)     Rx / DC Orders   ED Discharge Orders          Ordered    Buprenorphine  HCl-Naloxone  HCl (SUBOXONE ) 8-2 MG FILM  3 times daily        12/05/23 2310    OLANZapine  (ZYPREXA ) 5  MG tablet  Daily at bedtime        12/05/23 2310    cephALEXin  (KEFLEX ) 500 MG capsule  3 times daily        12/05/23 2310    sulfamethoxazole -trimethoprim  (BACTRIM  DS) 800-160 MG tablet  2 times daily        12/05/23 2310             Note:  This document was prepared using Dragon voice recognition software and may include unintentional dictation errors.   Viviann Pastor, MD 12/05/23 [id number]

## 2023-12-05 NOTE — ED Triage Notes (Signed)
 Pt states for approximately one week he's been having a painful, swollen lump in his perineal area. Denies pain to his testicles, urinary changes, discharge. No hx of abscesses per pt. Pt states he's also been having a cough, tactile fevers, and runny nose.   Pt states that he recently started suboxone  in the last two weeks. During triage, pt endorses suicidal thoughts within the last month and a general plan to run a hose from a car's exhaust and sit inside.

## 2023-12-05 NOTE — ED Notes (Signed)
 Pt changed into burgundy scrubs. His possessions are in one patient belonging's bag and include:  1 white lighter 1 pack of cigarettes 1 black wallet 1 blue cell phone  1 charging cord 1 black toboggan 1 brown hat.  1 gray hoodie 1 gray t-shirt 1 black pants 1 pair of shoes 1 pair of socks

## 2023-12-05 NOTE — Consult Note (Addendum)
 [facility] Consult Note  Patient Name: Jesse Dunn MRN: [id number] DOB: Apr 21, 1985 DATE OF Consult: 12/05/2023   TELEPSYCHIATRY ATTESTATION & CONSENT  As the provider for this telehealth consult, I attest that I verified the patient's identity using two separate identifiers, introduced myself to the patient, provided my credentials, disclosed my location, and performed this encounter via a HIPAA-compliant, real-time, face-to-face, two-way, interactive audio and video platform and with the full consent and agreement of the patient (or guardian as applicable.)  Patient physical location: [facility] . Telehealth provider physical location: home office in state of [place]  Video scheduled start time: 0900 pm (Central Time) Video end time: 0935 (Central Time)   PRIMARY PSYCHIATRIC DIAGNOSES (ICD-10 format preferred)  Substance use disorder/// SEVERE: fentanyl / just started suboxone   2 weeks ago Depressive disorder, unspecified SI with plan for CO poisoning for past one month now Low intention he states to me he will not follow through on plan; he will not committ suicide ---  RECOMMENDATIONS      Medication recommendations:    no need for inpatient psych . he states he will not go through with suicide. he will not commit suicide. he lives with mom and cares for her. he also has GF. -all protective factors for him . no past hx of SA. only psych hospital when 38 yo for ADHD. just started suboxone  in the past couple of weeks . he had a large addiction to fentanyl. he is being underdosed to low on his suboxone  .. feeling misery from such. suboxone  needs to go from tid to qid. he only sleeps 3 hours a night. please prescribe Zyprexa  5 mg tab. one to 2 tab po at bedtime. also if he can get a note to indicate that he suffers from a medical condition that warrants time off this will help him with his employer [employer]. they are breathing down his back to get rid of him. NO imminent SI that  would require an involuntary hold. please speak to him about ct scan results.   DC home     Non-Medication/therapeutic recommendations: no sitter needed  There are no psychiatric contraindications to discharge at this time   Plan Post Discharge/Psychiatric Care Follow-up resources please mental health and substance abuse referrals   Follow-Up Telepsychiatry C/L services: We will sign off for now. Please re-consult our service if needed for any concerning changes in the patient's condition, discharge planning, or questions.  Communication:  Treatment team members (and family members if applicable) who  were involved in treatment/care discussions and planning, and with whom we spoke  or engaged with via secure text/chat, include the following: ED attending   Thank you for involving us  in the care of this patient. If you have any additional  questions or concerns, please call [phone] and ask for me or the provider on-call   CHIEF COMPLAINT/REASON FOR CONSULT  SI with plan   HISTORY OF PRESENT ILLNESS (HPI)    38 year old male presents with a painful swollen lump and the perineal area . patient started Suboxone  last 2 weeks has Suicidal Thoughts last month the plan with carbon monoxide poisoning .  Patient claims he had a large appetite and tolerance for a fentanyl switch over to Suboxone  has been very difficult for him. he feels he's been underdosed on the Suboxone  and the doctor prescribing such as refusing to increase his dosage.  Patient having difficulty sleeping at night only getting a couple of hours of sleep tired and fatigue  during the day for which he works at [employer]. patient has had many absences at [facility] due to his difficulty in managing his addiction and his insomnia and his fatigue and his mood swings. he mentions he's feeling depressed and down and he's had thoughts about ending his life in the past one month about carbon monoxide poisoning. he states he would never  follow through on this plan because he has to care for his mother which he lives with and also he has a current girlfriend. patient is interested in psychiatric medications to help stabilize his mood and to help him sleep at night. he's had stimulants in the past he's had Zoloft in the past all of which have not been helpful for him. currently he claims he's clean not using fentanyl only suboxone . patient has no previous suicide attempts one psychiatric hospital when it was age 64 for ADHD. no cutting no other self-injury no psychosis no hallucinations no paranoia no delusions no mania. during my interview patient was calm Cooperative verbal responsive to questions.    Please consider increase suboxone   Add in Zyprexa  as above Needs note for employer  DC home with referrals   PAST PSYCHIATRIC HISTORY     Otherwise as per HPI above.  PAST MEDICAL HISTORY  No past medical history on file.    HOME MEDICATIONS  (Not in a hospital admission)       ALLERGIES  No Known Allergies  SOCIAL & SUBSTANCE USE HISTORY  Social History   Socioeconomic History   Marital status: Single    Spouse name: Not on file   Number of children: Not on file   Years of education: Not on file   Highest education level: Not on file  Occupational History   Not on file  Tobacco Use   Smoking status: Every Day    Types: Cigarettes   Smokeless tobacco: Never  Vaping Use   Vaping status: Never Used  Substance and Sexual Activity   Alcohol use: Never   Drug use: Never   Sexual activity: Not on file  Other Topics Concern   Not on file  Social History Narrative   Not on file   Social Drivers of Health   Financial Resource Strain: Not on file  Food Insecurity: Not on file  Transportation Needs: Not on file  Physical Activity: Not on file  Stress: Not on file  Social Connections: Not on file  fentanyl   FAMILY HISTORY  No family history on file. Family Psychiatric History (if known):           MENTAL STATUS EXAM (MSE)  Mental Status Exam: General Appearance: Casual  Orientation:  Full (Time, Place, and Person)  Memory:  intact  Concentration:  Concentration: Good  Recall:  Good  Attention  Good  Eye Contact:  Fair  Speech:  Clear and Coherent  Language:  Good  Volume:  Normal  Mood: dysthymic  Affect:  Appropriate and Congruent  Thought Process:  Coherent  Thought Content:  Negative  Suicidal Thoughts:  yes low intention   Homicidal Thoughts:  No  Judgement:  Good  Insight:  Good  Psychomotor Activity:  Normal  Akathisia:  No  Fund of Knowledge:  Good    Assets:  Communication Skills  Cognition:  WNL  ADL's:  Intact  AIMS (if indicated):          VITALS (IF TAKEN)   @VSR @   ,vs BP 114/79 (BP Location: Right Arm)   Pulse ROLLEN)  114   Temp (!) 101.3 F (38.5 C) (Oral)   Resp 16   SpO2 99%    LABS that are pertinent     ROS & ADDITIONAL FINDINGS  ROS: Notable for the following relevant positive findings: Psychiatric: per hpi Other notable positive ROS findings: per hpi  Additional findings:      Musculoskeletal:    [x]  No Abnormal Movements Observed        []  Impaired      Gait & Station:        [x]  Normal        []  Wheelchair/Walker          []  Laying/Sitting       Pain Screening:   []  Denies    [x]  Present--mild to moderate     []  Present--severe (will                             consider referral for ongoing evaluation and treatment)      Nutrition & Dental Concerns:no gross dental or  eating disorder   RISK ASSESSMENT*  Is the patient experiencing any suicidal or homicidal ideations:     [x] YES        []  NO       Explain if yes: per hpi Protective factors considered for safety management: verbal cooperative  Risk factors/concerns considered for safety management: (check all that apply) []  Prior attempt                                      []  Hopelessness       []  Family history of suicide                    []  Impulsivity [x]  Depression                                          []  Aggression [x]  Substance abuse/dependence          []  Isolation []  Physical illness/chronic pain              []  Barriers to accessing treatment []  Recent loss                                        []  Unwillingness to seek help []  Access to lethal means                      [x]  Male gender []  Age over 6                                        [x]  Unmarried  Is there a Astronomer plan with the patient and treatment team to minimize risk factors and promote protective factors:     [x]  YES      []  NO            Explain: routine nursing obs       Based on my current evaluation and risk assessment of the patient at the time of this encounter, this patient is  considered to be at:   []    Low Risk                      [x]   Moderate Risk                     []   High Risk  *RISK ASSESSMENT Risk assessment is a dynamic process; it is possible that this patient's condition, and risk level, may change. This should be re-evaluated and managed over time as appropriate. Please re-consult psychiatric consult services if additional assistance is needed in terms of risk assessment and management. If your team decides to discharge this patient, please advise the patient how to best access emergency psychiatric services, or to call 911, if their condition worsens or they feel unsafe in any way.   CW Lonni Ivanoff, M. D., [facility]

## 2023-12-05 NOTE — BH Assessment (Signed)
 [facility] consult has been placed.

## 2023-12-10 LAB — CULTURE, BLOOD (ROUTINE X 2)
Culture: NO GROWTH
Culture: NO GROWTH
Special Requests: ADEQUATE
Special Requests: ADEQUATE
